# Patient Record
Sex: Female | Born: 1960 | Hispanic: Yes | Marital: Single | Smoking: Never smoker
Health system: Southern US, Community
[De-identification: ages and names within clinical notes are randomized; demographics above are authoritative.]

---

## 2016-07-11 ENCOUNTER — Emergency Department: Payer: Self-pay

## 2016-07-11 ENCOUNTER — Emergency Department
Admission: EM | Admit: 2016-07-11 | Discharge: 2016-07-11 | Disposition: A | Discharge status: Home / Self Care | Payer: Self-pay | Attending: Emergency Medicine | Admitting: Emergency Medicine

## 2016-07-11 ENCOUNTER — Encounter: Payer: Self-pay | Admitting: Emergency Medicine

## 2016-07-11 DIAGNOSIS — R5383 Other fatigue: Secondary | ICD-10-CM | POA: Insufficient documentation

## 2016-07-11 LAB — GLUCOSE, CAPILLARY: GLUCOSE-CAPILLARY: 114 mg/dL — AB (ref 65–99)

## 2016-07-11 NOTE — ED Triage Notes (Signed)
Pt [place] ED [place] from [place] patient was [place].  Per EMS patient was trying to go to [facility] when started having headache.  Per interpreter patient reports eyes felt tired, denies other medical history.  Pt presents A&O, speaking coherently to interpreter.

## 2016-07-11 NOTE — ED Notes (Signed)
Pt sleeping on stretcher, warm blankets covering her. Lights dimmed for comfort.

## 2016-07-11 NOTE — ED Notes (Signed)
Dr. Manson Passey states that pt can sleep in room for a few hours and then will be discharged sometime this morning once the sun is up. Pt informed of plan.

## 2016-07-11 NOTE — ED Notes (Signed)
Patient transported to CT 

## 2016-07-11 NOTE — ED Notes (Signed)
Pt provided [place] sandwich tray, orange juice, and 2 warm blankets.

## 2016-07-11 NOTE — ED Notes (Signed)
Pt returned from CT via stretcher.

## 2016-07-11 NOTE — ED Notes (Signed)
Report received from Kate RN

## 2016-07-11 NOTE — ED Provider Notes (Signed)
[facility] Emergency Department Provider Note  ____________________________________________   First MD Initiated Contact with Patient 07/11/16 [phone]     (approximate)  I have reviewed the triage vital signs and the nursing notes.   HISTORY  Chief Complaint Migraine and Fatigue    HPI Tammy Villegas is a 55 y.o. female presents presents via EMS from the [place]. Per EMS the patient was trying to get to [facility] however was traveling in the opposite direction of [place]. Patient informed EMS that she would like for them to take her home that her "eyes were tired" and that she had a headache". Patient states that she's been driving since 9AM   Past medical history None There are no active problems to display for this patient.   Past surgical history None  Prior to Admission medications   Not on File    Allergies No known drug allergies History reviewed. No pertinent family history.  Social History Social History  Substance Use Topics  . Smoking status: Never Smoker  . Smokeless tobacco: Never Used  . Alcohol use No    Review of Systems Constitutional: No fever/chills Eyes: No visual changes. ENT: No sore throat. Cardiovascular: Denies chest pain. Respiratory: Denies shortness of breath. Gastrointestinal: No abdominal pain.  No nausea, no vomiting.  No diarrhea.  No constipation. Genitourinary: Negative for dysuria. Musculoskeletal: Negative for back pain. Skin: Negative for rash. Neurological: Positive for headaches and blurred vision  10-point ROS otherwise negative.  ____________________________________________   PHYSICAL EXAM:  VITAL SIGNS: ED Triage Vitals [07/11/16 0304]  Enc Vitals Group     BP 117/72     Pulse Rate 72     Resp 16     Temp 98 F (36.7 C)     Temp Source Oral     SpO2 99 %     Weight 160 lb (72.6 kg)     Height 5\' 6"  (1.676 m)     Head Circumference      Peak Flow      Pain Score      Pain Loc      Pain Edu?      Excl. in GC?     Constitutional: Alert and oriented. Well appearing and in no acute distress. Eyes: Conjunctivae are normal. PERRL. EOMI. Head: Atraumatic. Ears:  Healthy appearing ear canals and TMs bilaterally Nose: No congestion/rhinnorhea. Mouth/Throat: Mucous membranes are moist.  Oropharynx non-erythematous. Neck: No stridor.  No meningeal signs.   Cardiovascular: Normal rate, regular rhythm. Good peripheral circulation. Grossly normal heart sounds.   Respiratory: Normal respiratory effort.  No retractions. Lungs CTAB. Gastrointestinal: Soft and nontender. No distention.  Musculoskeletal: No lower extremity tenderness nor edema. No gross deformities of extremities. Neurologic:  Normal speech and language. No gross focal neurologic deficits are appreciated.  Skin:  Skin is warm, dry and intact. No rash noted. Psychiatric: Mood and affect are normal. Speech and behavior are normal.  ____________________________________________   LABS (all labs ordered are listed, but only abnormal results are displayed)  Labs Reviewed  GLUCOSE, CAPILLARY - Abnormal; Notable for the following:       Result Value   Glucose-Capillary 114 (*)    All other components within normal limits    RADIOLOGY I, [place] BROWN, personally viewed and evaluated these images (plain radiographs) as part of my medical decision making, as well as reviewing the written report by the radiologist.  Ct Head Wo Contrast  Result Date: 07/11/2016 CLINICAL DATA:  55 year old  female with headache EXAM: CT HEAD WITHOUT CONTRAST TECHNIQUE: Contiguous axial images were obtained from the base of the skull through the vertex without intravenous contrast. COMPARISON:  None. FINDINGS: The ventricles and the sulci are appropriate in size for the patient's age. There is no intracranial hemorrhage. No midline shift or mass effect identified. The gray-white matter differentiation is preserved. The  visualized paranasal sinuses and mastoid air cells are well aerated. The calvarium is intact. IMPRESSION: No acute intracranial pathology. Electronically Signed   By: Elgie Collard M.D.   On: 07/11/2016 03:31    ____________________________________________   PROCEDURES  Procedure(s) performed:   Procedures   ____________________________________________   INITIAL IMPRESSION / ASSESSMENT AND PLAN / ED COURSE  Pertinent labs & imaging results that were available during my care of the patient were reviewed by me and considered in my medical decision making (see chart for details).    Clinical Course    ____________________________________________  FINAL CLINICAL IMPRESSION(S) / ED DIAGNOSES  Final diagnoses:  Exhaustion     MEDICATIONS GIVEN DURING THIS VISIT:  Medications - No data to display   NEW OUTPATIENT MEDICATIONS STARTED DURING THIS VISIT:  New Prescriptions   No medications on file      Note:  This document was prepared using Dragon voice recognition software and may include unintentional dictation errors.    Darci Current, MD 07/12/16 [phone]

## 2018-03-23 IMAGING — CT CT HEAD W/O CM
3 series · 15 of 45 positions shown, 18 images · non-contrast
Comparison: None.

CLINICAL DATA: 54-year-old female with headache

EXAM:
CT HEAD WITHOUT CONTRAST
TECHNIQUE: Contiguous axial images were obtained from the base of the skull
through the vertex without intravenous contrast.

[Series 3: ax head wo · axial · 0.37mm/px · z∈[-56,+58]mm · 9 of 28 slices shown, 12 images]
[im 3/28  brain]
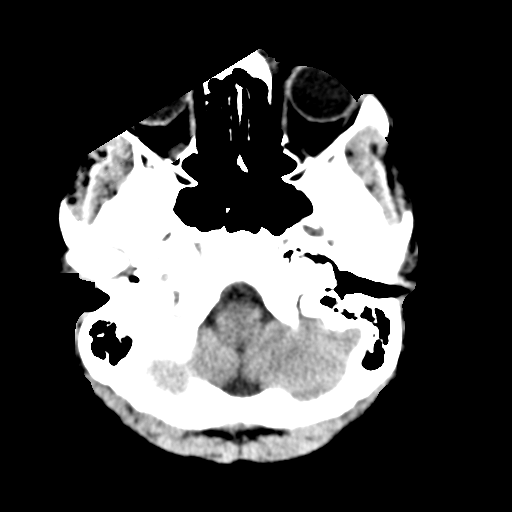
[im 3/28  bone]
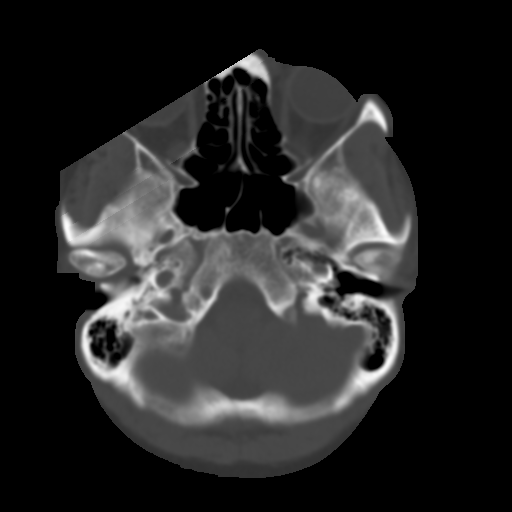
[im 6/28  brain]
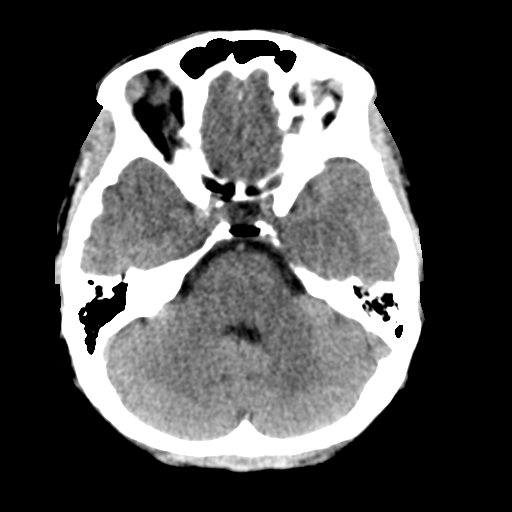
[im 9/28  brain]
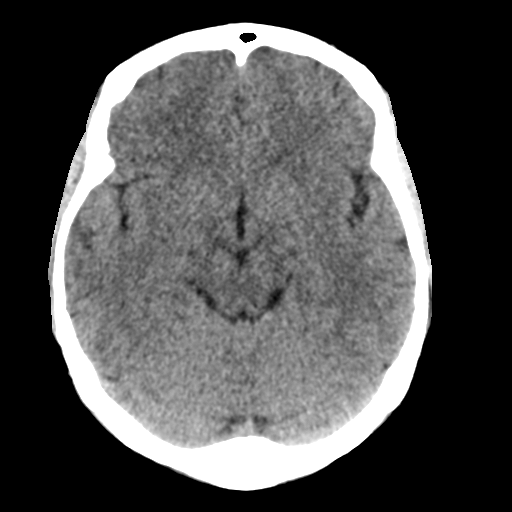
[im 12/28  brain]
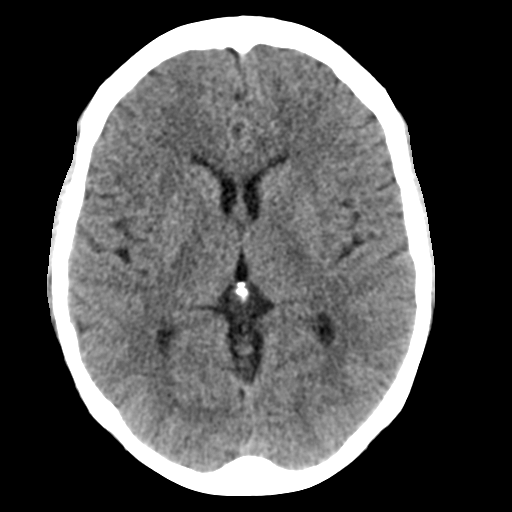
[im 15/28  brain]
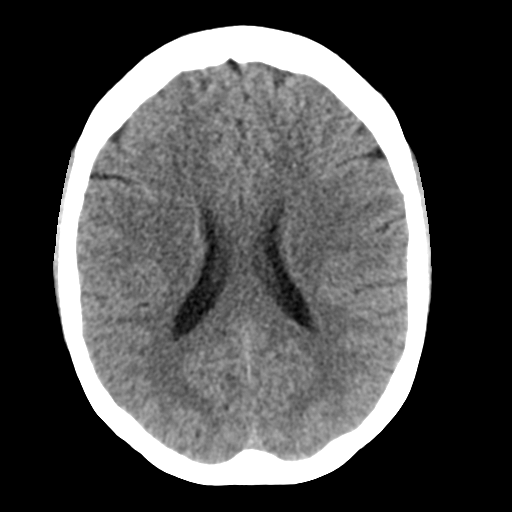
[im 15/28  bone]
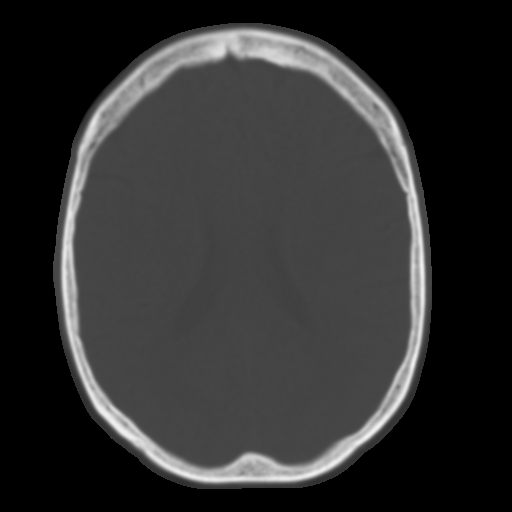
[im 17/28  brain]
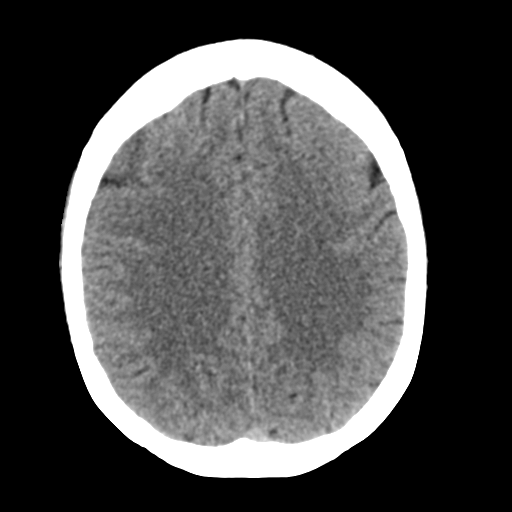
[im 20/28  brain]
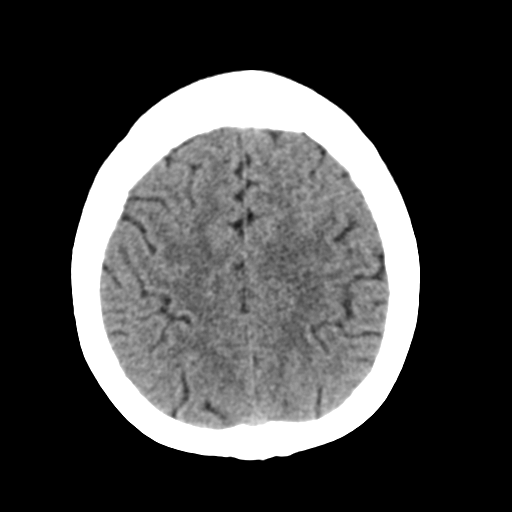
[im 23/28  brain]
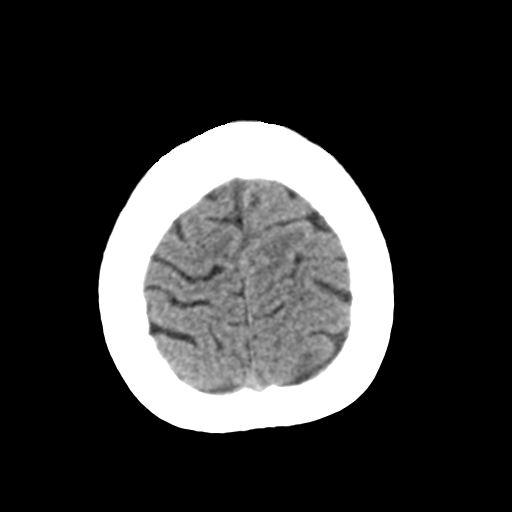
[im 26/28  brain]
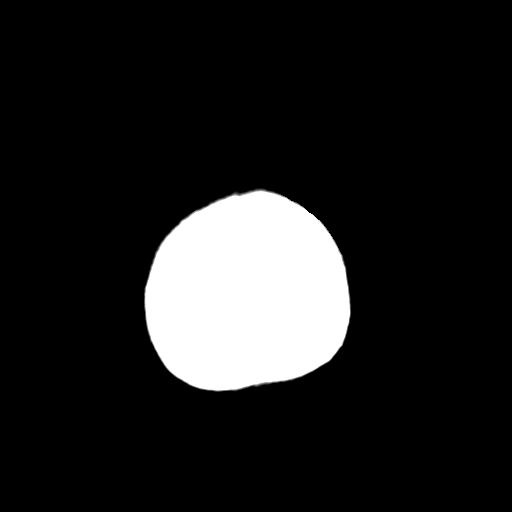
[im 26/28  bone]
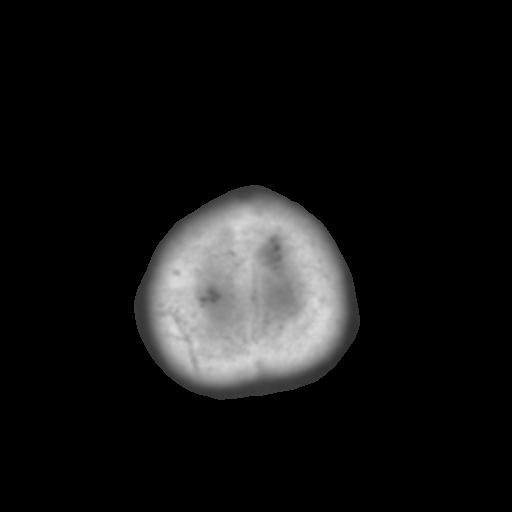

[Series 5: coronal soft tissue · coronal · 0.29mm/px · 3 of 59 slices shown]
[im 20/59  brain]
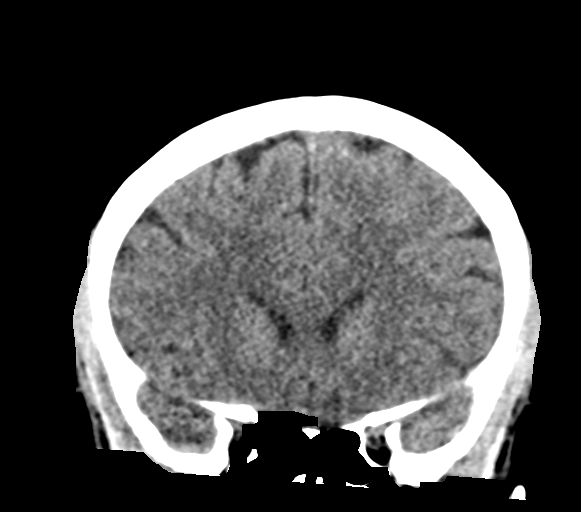
[im 26/59  brain]
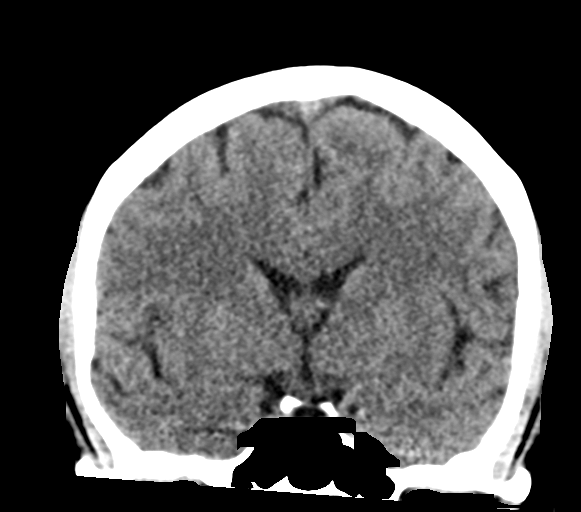
[im 33/59  brain]
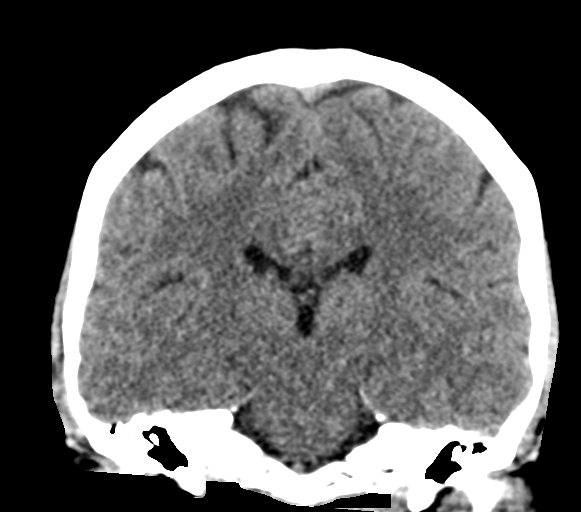

[Series 6: sagittal soft tissue · sagittal · 0.27mm/px · 3 of 50 slices shown]
[im 17/50  brain]
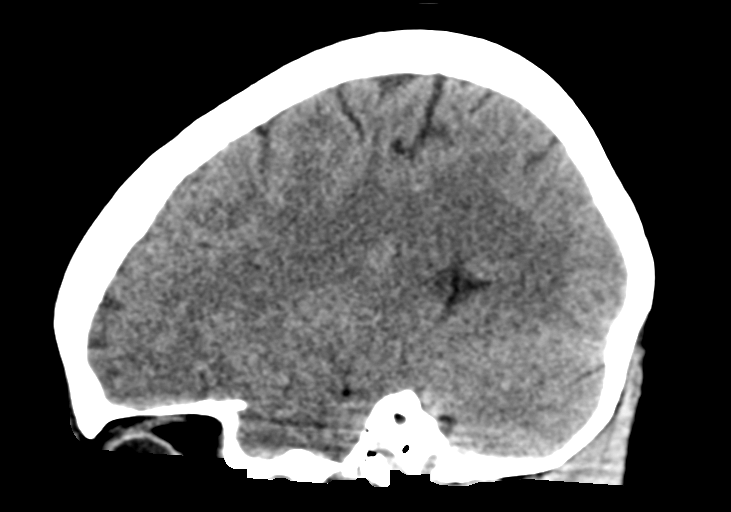
[im 25/50  brain]
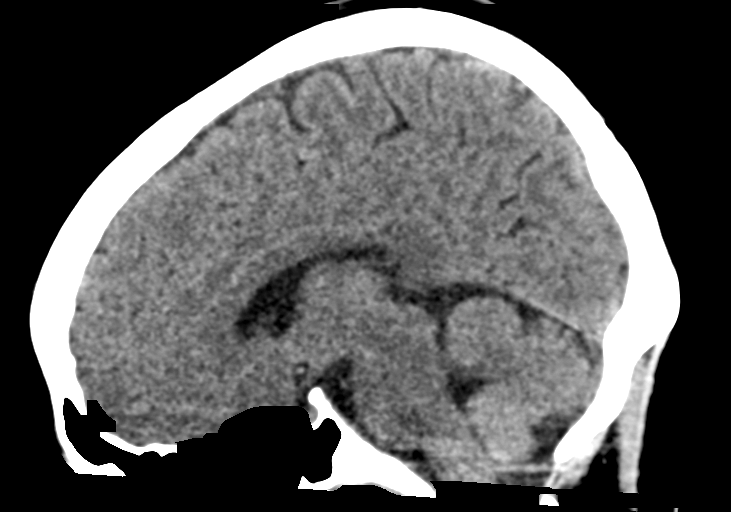
[im 33/50  brain]
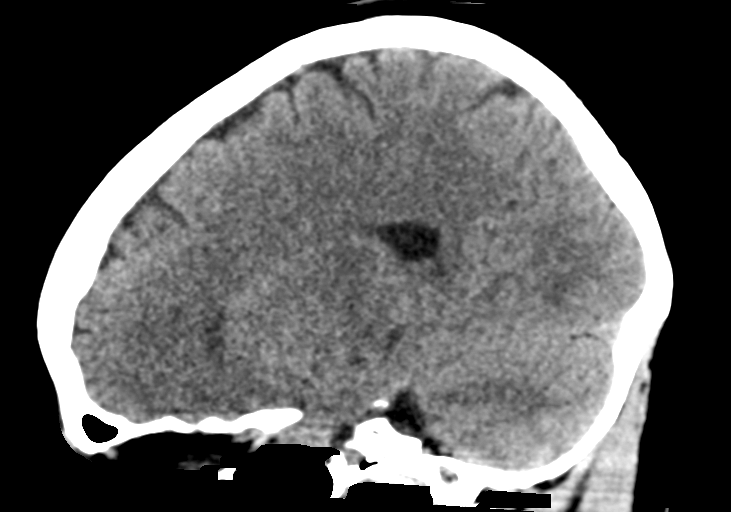

[15 of 45 positions shown; findings below may reference images not displayed]

FINDINGS: The ventricles and the sulci are appropriate in size for the
patient's age. There is no intracranial hemorrhage. No midline shift
or mass effect identified. The gray-white matter differentiation is
preserved.

The visualized paranasal sinuses and mastoid air cells are well
aerated. The calvarium is intact.
IMPRESSION: No acute intracranial pathology.

## 2020-04-11 ENCOUNTER — Ambulatory Visit: Payer: Self-pay | Attending: Internal Medicine

## 2020-04-11 DIAGNOSIS — Z23 Encounter for immunization: Secondary | ICD-10-CM

## 2020-04-11 NOTE — Progress Notes (Signed)
   Covid-19 Vaccination Clinic  Name:  Kaileigh Viswanathan    MRN: [id number] DOB: [birth year]  04/11/2020  Ms. Klare was observed post Covid-19 immunization for 30 minutes based on pre-vaccination screening without incident. She was provided with Vaccine Information Sheet and instruction to access the V-Safe system.   Ms. Rubey was instructed to call 911 with any severe reactions post vaccine: Marland Kitchen Difficulty breathing  . Swelling of face and throat  . A fast heartbeat  . A bad rash all over body  . Dizziness and weakness   Immunizations Administered    Name Date Dose VIS Date Route   Pfizer COVID-19 Vaccine 04/11/2020  2:39 PM 0.3 mL 02/02/2019 Intramuscular   Manufacturer: [employer], [employer]   Lot: [id number]   NDC: [address]-4

## 2020-05-09 ENCOUNTER — Ambulatory Visit: Payer: Self-pay
